# Patient Record
Sex: Female | Born: 2000 | Race: Black or African American | Hispanic: No | Marital: Single | Smoking: Current every day smoker
Health system: Southern US, Community
[De-identification: ages and names within clinical notes are randomized; demographics above are authoritative.]

## PROBLEM LIST (undated history)

## (undated) DIAGNOSIS — Z789 Other specified health status: Secondary | ICD-10-CM

## (undated) DIAGNOSIS — N390 Urinary tract infection, site not specified: Secondary | ICD-10-CM

## (undated) HISTORY — DX: Other specified health status: Z78.9

## (undated) HISTORY — PX: NO PAST SURGERIES: SHX2092

---
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
## 2019-08-11 ENCOUNTER — Encounter (HOSPITAL_BASED_OUTPATIENT_CLINIC_OR_DEPARTMENT_OTHER): Payer: Self-pay

## 2019-08-11 ENCOUNTER — Emergency Department (HOSPITAL_BASED_OUTPATIENT_CLINIC_OR_DEPARTMENT_OTHER): Payer: Managed Care, Other (non HMO)

## 2019-08-11 ENCOUNTER — Other Ambulatory Visit: Payer: Self-pay

## 2019-08-11 ENCOUNTER — Emergency Department (HOSPITAL_BASED_OUTPATIENT_CLINIC_OR_DEPARTMENT_OTHER)
Admission: EM | Admit: 2019-08-11 | Discharge: 2019-08-12 | Disposition: A | Discharge status: Home / Self Care | Payer: Managed Care, Other (non HMO) | Attending: Emergency Medicine | Admitting: Emergency Medicine

## 2019-08-11 DIAGNOSIS — B2790 Infectious mononucleosis, unspecified without complication: Secondary | ICD-10-CM | POA: Diagnosis not present

## 2019-08-11 DIAGNOSIS — R0602 Shortness of breath: Secondary | ICD-10-CM | POA: Diagnosis not present

## 2019-08-11 DIAGNOSIS — J029 Acute pharyngitis, unspecified: Secondary | ICD-10-CM | POA: Diagnosis present

## 2019-08-11 DIAGNOSIS — Z20828 Contact with and (suspected) exposure to other viral communicable diseases: Secondary | ICD-10-CM | POA: Diagnosis not present

## 2019-08-11 MED ORDER — SODIUM CHLORIDE 0.9 % IV BOLUS
1000.0000 mL | Freq: Once | INTRAVENOUS | Status: AC
  Administered 2019-08-11: 23:00:00 1000 mL via INTRAVENOUS

## 2019-08-11 MED ORDER — DEXAMETHASONE SODIUM PHOSPHATE 10 MG/ML IJ SOLN
10.0000 mg | Freq: Once | INTRAMUSCULAR | Status: AC
  Administered 2019-08-11: 10 mg via INTRAVENOUS
  Filled 2019-08-11: qty 1

## 2019-08-11 NOTE — ED Provider Notes (Signed)
[facility] Emergency Department Provider Note MRN:  [id number]  Arrival date & time: 08/11/19     Chief Complaint   Sore Throat   History of Present Illness   Brittney Richardson is a 18 y.o. year-old female with no pertinent past medical history presenting to the ED with chief complaint of sore throat.  1 week of sore throat, which has become severe.  Was tested for strep throat and this was negative, has since tested positive for mono.  She is endorsing generalized discomfort, chest pain, abdominal pain, occasional shortness of breath, malaise.  Denies headache, no vision change, no numbness or weakness to the arms or legs, no leg pain or swelling.  Trouble eating and drinking due to the sore throat.  Review of Systems  A complete 10 system review of systems was obtained and all systems are negative except as noted in the HPI and PMH.   Patient's Health History    Past Medical History:  Diagnosis Date  . UTI (lower urinary tract infection)     History reviewed. No pertinent surgical history.  No family history on file.  Social History   Socioeconomic History  . Marital status: Single    Spouse name: Not on file  . Number of children: Not on file  . Years of education: Not on file  . Highest education level: Not on file  Occupational History  . Not on file  Social Needs  . Financial resource strain: Not on file  . Food insecurity    Worry: Not on file    Inability: Not on file  . Transportation needs    Medical: Not on file    Non-medical: Not on file  Tobacco Use  . Smoking status: Never Smoker  . Smokeless tobacco: Never Used  Substance and Sexual Activity  . Alcohol use: Not Currently  . Drug use: Yes    Types: Marijuana  . Sexual activity: Not on file  Lifestyle  . Physical activity    Days per week: Not on file    Minutes per session: Not on file  . Stress: Not on file  Relationships  . Social Herbalist on  phone: Not on file    Gets together: Not on file    Attends religious service: Not on file    Active member of club or organization: Not on file    Attends meetings of clubs or organizations: Not on file    Relationship status: Not on file  . Intimate partner violence    Fear of current or ex partner: Not on file    Emotionally abused: Not on file    Physically abused: Not on file    Forced sexual activity: Not on file  Other Topics Concern  . Not on file  Social History Narrative  . Not on file     Physical Exam  Vital Signs and Nursing Notes reviewed Vitals:   08/11/19 2144  BP: (!) 114/92  Pulse: (!) 119  Resp: 20  Temp: 100.1 F (37.8 C)  SpO2: 98%    CONSTITUTIONAL: Well-appearing, NAD NEURO:  Alert and oriented x 3, no focal deficits EYES:  eyes equal and reactive ENT/NECK:  no LAD, no JVD; significantly swollen and erythematous tonsils with bilateral exudate no asymmetry CARDIO: Regular rate, well-perfused, normal S1 and S2 PULM:  CTAB no wheezing or rhonchi GI/GU:  normal bowel sounds, non-distended, non-tender MSK/SPINE:  No gross deformities, no edema SKIN:  no  rash, atraumatic PSYCH:  Appropriate speech and behavior  Diagnostic and Interventional Summary    EKG Interpretation  Date/Time:  Thursday August 11 2019 22:45:02 EST Ventricular Rate:  101 PR Interval:    QRS Duration: 61 QT Interval:  309 QTC Calculation: 401 R Axis:   85 Text Interpretation: Sinus tachycardia Borderline T abnormalities, anterior leads Confirmed by Kennis Carina [phone]) on 08/11/2019 10:53:01 PM      Labs Reviewed  SARS CORONAVIRUS 2 (TAT 6-24 HRS)    XR Chest Single View  Final Result      Medications  dexamethasone (DECADRON) injection 10 mg (10 mg Intravenous Given 08/11/19 2238)  sodium chloride 0.9 % bolus 1,000 mL (1,000 mLs Intravenous New Bag/Given 08/11/19 2238)     Procedures  /  Critical Care Procedures  ED Course and Medical Decision Making  I have  reviewed the triage vital signs and the nursing notes.  Pertinent labs & imaging results that were available during my care of the patient were reviewed by me and considered in my medical decision making (see below for details).     Favoring continued symptoms related to mono, also considering COVID-19.  Nothing on exam to suggest peritonsillar or retropharyngeal abscess.  Chest discomfort and shortness of breath do not seem concerning, appear to be more of a general discomfort in the setting of viral illness.  Screening with EKG and chest x-ray.  Patient is tachycardic on arrival but this is thought to be related to dehydration given her trouble with p.o. intake.  Providing IV fluids, steroids, will reassess.  Tachycardia is resolved with only half liter fluids completed.  Patient is continuing to look well.  EKG and chest x-ray reassuring.  She is appropriate for discharge with continued symptomatic management.  Elmer Sow. Pilar Plate, MD [facility] Emergency Medicine [facility] [email or web address]  Final Clinical Impressions(s) / ED Diagnoses     ICD-10-CM   1. Infectious mononucleosis without complication, infectious mononucleosis due to unspecified organism  B27.90   2. SOB (shortness of breath)  R06.02 XR Chest Single View    XR Chest Single View    ED Discharge Orders    None       Discharge Instructions Discussed with and Provided to Patient:     Discharge Instructions     You were evaluated in the Emergency Department and after careful evaluation, we did not find any emergent condition requiring admission or further testing in the hospital.  Your exam/testing today is overall reassuring.  We gave you a steroid medicine here in the emergency department that should help with the swelling and symptoms.  Please drink plenty of fluids at home to stay hydrated.  As discussed, please avoid any trauma or contact sports for the next month.  Please return to the  Emergency Department if you experience any worsening of your condition.  We encourage you to follow up with a primary care provider.  Thank you for allowing [place] to be a part of your care.       Sabas Sous, MD 08/11/19 [phone]

## 2019-08-11 NOTE — ED Triage Notes (Addendum)
Pt c/o sore throat x 7 days -was seen by PCP x 2 this week-neg strep but was stared on amxoil then advised to stop when pos mono test-pt c/o cont'd sore throat, cough, runny nose-NAD-steady gait-pt with muffled voice and difficulty swallowing saliva-tonsils touching and white spots noted

## 2019-08-11 NOTE — Discharge Instructions (Addendum)
You were evaluated in the Emergency Department and after careful evaluation, we did not find any emergent condition requiring admission or further testing in the hospital.  Your exam/testing today is overall reassuring.  We gave you a steroid medicine here in the emergency department that should help with the swelling and symptoms.  Please drink plenty of fluids at home to stay hydrated.  As discussed, please avoid any trauma or contact sports for the next month.  Please return to the Emergency Department if you experience any worsening of your condition.  We encourage you to follow up with a primary care provider.  Thank you for allowing [place] to be a part of your care.

## 2019-08-11 NOTE — ED Notes (Signed)
ED Provider at bedside. 

## 2019-08-12 LAB — SARS CORONAVIRUS 2 (TAT 6-24 HRS): SARS Coronavirus 2: NEGATIVE

## 2019-12-31 ENCOUNTER — Emergency Department (HOSPITAL_COMMUNITY): Payer: Managed Care, Other (non HMO)

## 2019-12-31 ENCOUNTER — Other Ambulatory Visit: Payer: Self-pay

## 2019-12-31 ENCOUNTER — Emergency Department (HOSPITAL_COMMUNITY)
Admission: EM | Admit: 2019-12-31 | Discharge: 2019-12-31 | Disposition: A | Discharge status: Home / Self Care | Payer: Managed Care, Other (non HMO) | Attending: Emergency Medicine | Admitting: Emergency Medicine

## 2019-12-31 ENCOUNTER — Encounter (HOSPITAL_COMMUNITY): Payer: Self-pay | Admitting: Emergency Medicine

## 2019-12-31 DIAGNOSIS — Y9389 Activity, other specified: Secondary | ICD-10-CM | POA: Insufficient documentation

## 2019-12-31 DIAGNOSIS — R109 Unspecified abdominal pain: Secondary | ICD-10-CM | POA: Insufficient documentation

## 2019-12-31 DIAGNOSIS — M542 Cervicalgia: Secondary | ICD-10-CM | POA: Insufficient documentation

## 2019-12-31 DIAGNOSIS — M25512 Pain in left shoulder: Secondary | ICD-10-CM | POA: Diagnosis present

## 2019-12-31 DIAGNOSIS — Y92410 Unspecified street and highway as the place of occurrence of the external cause: Secondary | ICD-10-CM | POA: Diagnosis not present

## 2019-12-31 DIAGNOSIS — M25532 Pain in left wrist: Secondary | ICD-10-CM | POA: Diagnosis not present

## 2019-12-31 DIAGNOSIS — Y999 Unspecified external cause status: Secondary | ICD-10-CM | POA: Diagnosis not present

## 2019-12-31 DIAGNOSIS — M25561 Pain in right knee: Secondary | ICD-10-CM | POA: Insufficient documentation

## 2019-12-31 MED ORDER — IBUPROFEN 400 MG PO TABS
600.0000 mg | ORAL_TABLET | Freq: Once | ORAL | Status: AC
  Administered 2019-12-31: 600 mg via ORAL
  Filled 2019-12-31: qty 1

## 2019-12-31 NOTE — Discharge Instructions (Addendum)
All Xrays are normal, there is no fractures.   Tylenol and ibuprofen for pain. Please return for any new or worsening symptoms.
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
## 2019-12-31 NOTE — ED Notes (Signed)
Pt. Transported to xray 

## 2020-01-26 ENCOUNTER — Ambulatory Visit: Payer: No Typology Code available for payment source | Attending: Family

## 2020-01-26 DIAGNOSIS — Z23 Encounter for immunization: Secondary | ICD-10-CM

## 2020-01-26 NOTE — Progress Notes (Signed)
   Covid-19 Vaccination Clinic  Name:  Brittney Richardson    MRN: [id number] DOB: [birth year]  01/26/2020  Brittney Richardson was observed post Covid-19 immunization for 15 minutes without incident. She was provided with Vaccine Information Sheet and instruction to access the V-Safe system.   Brittney Richardson was instructed to call 911 with any severe reactions post vaccine: Marland Kitchen Difficulty breathing  . Swelling of face and throat  . A fast heartbeat  . A bad rash all over body  . Dizziness and weakness   Immunizations Administered    Name Date Dose VIS Date Route   Moderna COVID-19 Vaccine 01/26/2020  1:39 PM 0.5 mL 09/2019 Intramuscular   Manufacturer: Moderna   Lot: [id number]   NDC: 10071-219-75

## 2020-02-21 ENCOUNTER — Ambulatory Visit: Payer: No Typology Code available for payment source | Attending: Family

## 2020-02-21 ENCOUNTER — Ambulatory Visit: Payer: No Typology Code available for payment source

## 2020-02-21 DIAGNOSIS — Z23 Encounter for immunization: Secondary | ICD-10-CM

## 2020-02-21 NOTE — Progress Notes (Signed)
   Covid-19 Vaccination Clinic  Name:  Brittney Richardson    MRN: [id number] DOB: October 29, 2000  02/21/2020  [name] was observed post Covid-19 immunization for 15 minutes without incident. She was provided with Vaccine Information Sheet and instruction to access the V-Safe system.   [name] was instructed to call 911 with any severe reactions post vaccine: Marland Kitchen Difficulty breathing  . Swelling of face and throat  . A fast heartbeat  . A bad rash all over body  . Dizziness and weakness   Immunizations Administered    Name Date Dose VIS Date Route   Moderna COVID-19 Vaccine 02/21/2020  2:29 PM 0.5 mL 09/2019 Intramuscular   Manufacturer: Moderna   Lot: [id number]   NDC: 66664-861-61

## 2020-10-05 IMAGING — DX DG CHEST 1V PORT
1 series · 1 of 1 positions shown · non-contrast
Comparison: None.

CLINICAL DATA: Sore throat

EXAM:
PORTABLE CHEST 1 VIEW

[chest ap]
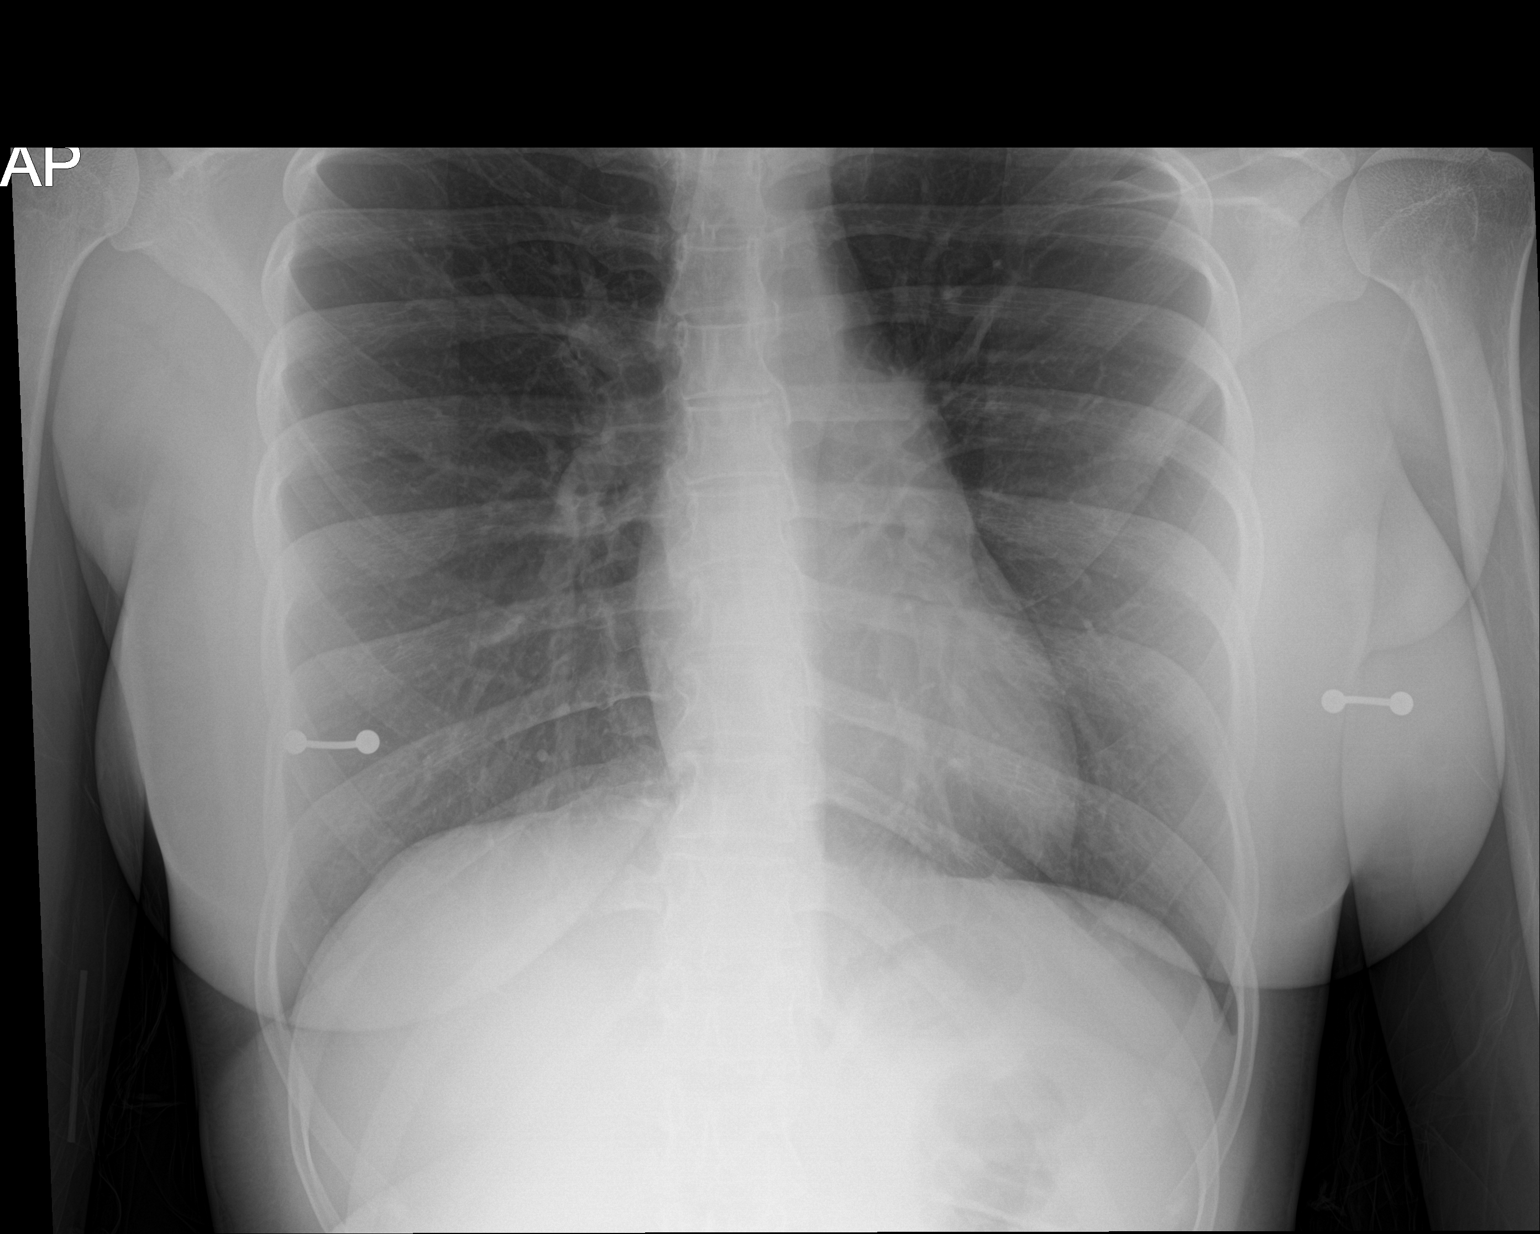

[1 of 1 positions shown; findings below may reference images not displayed]

FINDINGS: The heart size and mediastinal contours are within normal limits.
Both lungs are clear. The visualized skeletal structures are
unremarkable. Bilateral nipple rings.
IMPRESSION: No active disease.

## 2020-11-06 ENCOUNTER — Ambulatory Visit: Payer: Self-pay | Attending: Internal Medicine

## 2020-11-06 DIAGNOSIS — Z23 Encounter for immunization: Secondary | ICD-10-CM

## 2020-11-06 NOTE — Progress Notes (Signed)
   Covid-19 Vaccination Clinic  Name:  Brittney Richardson    MRN: [id number] DOB: Mar 24, 2001  11/06/2020  [name] was observed post Covid-19 immunization for 15 minutes without incident. She was provided with Vaccine Information Sheet and instruction to access the V-Safe system.   [name] was instructed to call 911 with any severe reactions post vaccine: Marland Kitchen Difficulty breathing  . Swelling of face and throat  . A fast heartbeat  . A bad rash all over body  . Dizziness and weakness   Immunizations Administered    Name Date Dose VIS Date Route   Moderna Covid-19 Booster Vaccine 11/06/2020  1:38 PM 0.25 mL 07/25/2020 Intramuscular   Manufacturer: Gala Murdoch   Lot: [id number]   NDC: 22575-051-83

## 2021-12-22 ENCOUNTER — Other Ambulatory Visit: Payer: Self-pay

## 2021-12-22 ENCOUNTER — Emergency Department (HOSPITAL_COMMUNITY)
Admission: EM | Admit: 2021-12-22 | Discharge: 2021-12-23 | Disposition: A | Discharge status: Home / Self Care | Payer: 59 | Attending: Emergency Medicine | Admitting: Emergency Medicine

## 2021-12-22 ENCOUNTER — Encounter (HOSPITAL_COMMUNITY): Payer: Self-pay

## 2021-12-22 DIAGNOSIS — J358 Other chronic diseases of tonsils and adenoids: Secondary | ICD-10-CM | POA: Insufficient documentation

## 2021-12-22 DIAGNOSIS — B9789 Other viral agents as the cause of diseases classified elsewhere: Secondary | ICD-10-CM | POA: Diagnosis not present

## 2021-12-22 DIAGNOSIS — J028 Acute pharyngitis due to other specified organisms: Secondary | ICD-10-CM | POA: Insufficient documentation

## 2021-12-22 DIAGNOSIS — J029 Acute pharyngitis, unspecified: Secondary | ICD-10-CM | POA: Diagnosis present

## 2021-12-22 DIAGNOSIS — Z20822 Contact with and (suspected) exposure to covid-19: Secondary | ICD-10-CM | POA: Insufficient documentation

## 2021-12-22 DIAGNOSIS — H9203 Otalgia, bilateral: Secondary | ICD-10-CM | POA: Insufficient documentation

## 2021-12-22 NOTE — ED Triage Notes (Addendum)
Pt reports with sore throat and redness, congestion, headache, bilateral ear pain, and cough since Thursday. Pt states that she has tonsil stones.  ?

## 2021-12-23 LAB — RESP PANEL BY RT-PCR (FLU A&B, COVID) ARPGX2
Influenza A by PCR: NEGATIVE
Influenza B by PCR: NEGATIVE
SARS Coronavirus 2 by RT PCR: NEGATIVE

## 2021-12-23 LAB — GROUP A STREP BY PCR: Group A Strep by PCR: NOT DETECTED

## 2021-12-23 MED ORDER — DEXAMETHASONE 4 MG PO TABS
10.0000 mg | ORAL_TABLET | Freq: Once | ORAL | Status: AC
  Administered 2021-12-23: 10 mg via ORAL
  Filled 2021-12-23: qty 1

## 2021-12-23 NOTE — ED Provider Notes (Signed)
?[place] DEPT ?[facility] Emergency Department ?Provider Note ?MRN:  [id number]  ?Arrival date & time: 12/23/21    ? ?Chief Complaint   ?Sore Throat, Nasal Congestion, Headache, Cough, and Otalgia ?  ?History of Present Illness   ?Brittney Richardson is a 21 y.o. year-old female with no pertinent past medical history presenting to the ED with chief complaint of sore throat. ? ?Headache, nasal congestion, cough, bilateral otalgia, sore throat for the past few days.  Also feels like she has tonsil stones. ? ?Review of Systems  ?A thorough review of systems was obtained and all systems are negative except as noted in the HPI and PMH.  ? ?Patient's Health History   ? ?Past Medical History:  ?Diagnosis Date  ? UTI (lower urinary tract infection)   ?  ?History reviewed. No pertinent surgical history.  ?History reviewed. No pertinent family history.  ?Social History  ? ?Socioeconomic History  ? Marital status: Single  ?  Spouse name: Not on file  ? Number of children: Not on file  ? Years of education: Not on file  ? Highest education level: Not on file  ?Occupational History  ? Not on file  ?Tobacco Use  ? Smoking status: Never  ? Smokeless tobacco: Never  ?Vaping Use  ? Vaping Use: Never used  ?Substance and Sexual Activity  ? Alcohol use: Not Currently  ? Drug use: Yes  ?  Types: Marijuana  ? Sexual activity: Not on file  ?Other Topics Concern  ? Not on file  ?Social History Narrative  ? Not on file  ? ?Social Determinants of Health  ? ?Financial Resource Strain: Not on file  ?Food Insecurity: Not on file  ?Transportation Needs: Not on file  ?Physical Activity: Not on file  ?Stress: Not on file  ?Social Connections: Not on file  ?Intimate Partner Violence: Not on file  ?  ? ?Physical Exam  ? ?Vitals:  ? 12/22/21 2314  ?BP: 130/84  ?Pulse: 90  ?Resp: 16  ?Temp: 98.5 ?F (36.9 ?C)  ?SpO2: 95%  ?  ?CONSTITUTIONAL: Well-appearing, NAD ?NEURO/PSYCH:  Alert and oriented x 3, no focal deficits, no meningismus ?EYES:   eyes equal and reactive ?ENT/NECK:  no LAD, no JVD; erythema to the posterior oropharynx, tonsils are mildly erythematous and enlarged, white tonsil stones noted bilaterally ?CARDIO: Regular rate, well-perfused, normal S1 and S2 ?PULM:  CTAB no wheezing or rhonchi ?GI/GU:  non-distended, non-tender ?MSK/SPINE:  No gross deformities, no edema ?SKIN:  no rash, atraumatic ? ? ?*Additional and/or pertinent findings included in MDM below ? ?Diagnostic and Interventional Summary  ? ? EKG Interpretation ? ?Date/Time:    ?Ventricular Rate:    ?PR Interval:    ?QRS Duration:   ?QT Interval:    ?QTC Calculation:   ?R Axis:     ?Text Interpretation:   ?  ? ?  ? ?Labs Reviewed  ?GROUP A STREP BY PCR  ?RESP PANEL BY RT-PCR (FLU A&B, COVID) [id number]  ?  ?No orders to display  ?  ?Medications  ?dexamethasone (DECADRON) tablet 10 mg (10 mg Oral Given 12/23/21 0025)  ?  ? ?Procedures  /  Critical Care ?Procedures ? ?ED Course and Medical Decision Making  ?Initial Impression and Ddx ?Favoring viral illness, will swab for strep, COVID, RSV.  Vital signs normal, well-appearing, lungs clear. ? ?Past medical/surgical history that increases complexity of ED encounter: None ? ?Interpretation of Diagnostics ?COVID, flu, strep negative ? ?Patient Reassessment and Ultimate Disposition/Management ?Discharge home.  Providing Decadron for symptomatic relief given the severity of her sore throat.  Nothing on exam to suggest PTA or RPA. ? ?Patient management required discussion with the following services or consulting groups:  None ? ?Complexity of Problems Addressed ?Acute complicated illness or Injury ? ?Additional Data Reviewed and Analyzed ?Further history obtained from: ?None ? ?Additional Factors Impacting ED Encounter Risk ?None ? ?Barth Kirks. Sedonia Small, MD ?[facility] Emergency Medicine ?[email or web address] ? ?Final Clinical Impressions(s) / ED Diagnoses  ? ?  ICD-10-CM   ?1. Viral pharyngitis  J02.9   ?  ?2. Tonsil  stone  J35.8   ?  ?  ?ED Discharge Orders   ? ? None  ? ?  ?  ? ?Discharge Instructions Discussed with and Provided to Patient:  ? ? ?Discharge Instructions   ? ?  ?You were evaluated in the Emergency Department and after careful evaluation, we did not find any emergent condition requiring admission or further testing in the hospital. ? ?Your exam/testing today was overall reassuring.  Your COVID, flu, strep testing was negative.  Recommend continued use of Tylenol and Motrin at home for discomfort, suspect her symptoms are due to some other virus.  If your tonsil stones are still there after 1 week, you can follow-up with the ENT specialists. ? ?Please return to the Emergency Department if you experience any worsening of your condition.  Thank you for allowing [place] to be a part of your care. ? ? ? ? ?  ?Maudie Flakes, MD ?12/23/21 0034 ? ?

## 2021-12-23 NOTE — Discharge Instructions (Signed)
You were evaluated in the Emergency Department and after careful evaluation, we did not find any emergent condition requiring admission or further testing in the hospital. ? ?Your exam/testing today was overall reassuring.  Your COVID, flu, strep testing was negative.  Recommend continued use of Tylenol and Motrin at home for discomfort, suspect her symptoms are due to some other virus.  If your tonsil stones are still there after 1 week, you can follow-up with the ENT specialists. ? ?Please return to the Emergency Department if you experience any worsening of your condition.  Thank you for allowing [place] to be a part of your care. ? ?

## 2023-05-07 ENCOUNTER — Emergency Department (HOSPITAL_COMMUNITY)
Admission: EM | Admit: 2023-05-07 | Discharge: 2023-05-07 | Discharge status: Against Medical Advice | Payer: 59 | Source: Home / Self Care

## 2023-06-16 LAB — HM PAP SMEAR

## 2023-09-10 ENCOUNTER — Encounter (HOSPITAL_BASED_OUTPATIENT_CLINIC_OR_DEPARTMENT_OTHER): Payer: Self-pay | Admitting: Emergency Medicine

## 2023-09-10 ENCOUNTER — Other Ambulatory Visit: Payer: Self-pay

## 2023-09-10 ENCOUNTER — Emergency Department (HOSPITAL_BASED_OUTPATIENT_CLINIC_OR_DEPARTMENT_OTHER)
Admission: EM | Admit: 2023-09-10 | Discharge: 2023-09-10 | Disposition: A | Discharge status: Home / Self Care | Payer: 59 | Attending: Emergency Medicine | Admitting: Emergency Medicine

## 2023-09-10 DIAGNOSIS — U071 COVID-19: Secondary | ICD-10-CM | POA: Insufficient documentation

## 2023-09-10 DIAGNOSIS — R059 Cough, unspecified: Secondary | ICD-10-CM | POA: Diagnosis present

## 2023-09-10 LAB — RESP PANEL BY RT-PCR (RSV, FLU A&B, COVID)  RVPGX2
Influenza A by PCR: NEGATIVE
Influenza B by PCR: NEGATIVE
Resp Syncytial Virus by PCR: NEGATIVE
SARS Coronavirus 2 by RT PCR: POSITIVE — AB

## 2023-09-10 NOTE — Discharge Instructions (Signed)
Please use Tylenol or ibuprofen for pain.  You may use 600 mg ibuprofen every 6 hours or 1000 mg of Tylenol every 6 hours.  You may choose to alternate between the 2.  This would be most effective.  Not to exceed 4 g of Tylenol within 24 hours.  Not to exceed 3200 mg ibuprofen 24 hours.  Please drink plenty of fluids, and use over-the-counter cough and cold medication such as Robitussin, NyQuil, DayQuil, Mucinex for symptomatic relief, make sure that you do not take any medication that contains Tylenol along with an oral dose of Tylenol, the maximum dose that you are trying to avoid exceeding is 1000 mg.  You are okay to return to work on Saturday as long as you are fever free.  If you continue to have worsening symptoms, shortness of breath recommend that you return to the emergency department for further evaluation.

## 2023-09-10 NOTE — ED Triage Notes (Signed)
Pt c/o cough, sore throat, chills, SOB, congestion. Positive COVID test at home, requesting to be tested here and doctor's note for work.

## 2023-09-10 NOTE — ED Provider Notes (Signed)
[place] EMERGENCY DEPARTMENT AT [facility] Provider Note   CSN: [id number] Arrival date & time: 09/10/23  1022     History  Chief Complaint  Patient presents with   Cough    Brittney Richardson is a 22 y.o. female with overall noncontributory past medical history presents concern for 3 days of cough, sore throat, runny nose, congestion.  Patient reports that she tested positive for COVID at home today, began having symptoms on Monday.  She has been taking Robitussin, NyQuil and does report that her symptoms are improving.  She has had to miss work and was requesting a work note.   Cough      Home Medications Prior to Admission medications   Medication Sig Start Date End Date Taking? Authorizing Provider  ondansetron (ZOFRAN ODT) 4 MG disintegrating tablet Take 1 tablet (4 mg total) by mouth every 6 (six) hours as needed for nausea or vomiting. 11/10/18   Ward, Layla Maw, DO  sulfamethoxazole-trimethoprim (BACTRIM,SEPTRA) 200-40 MG/5ML suspension 20 mls po bid x 10 days 10/06/14   Viviano Simas, NP      Allergies    Patient has no known allergies.    Review of Systems   Review of Systems  Respiratory:  Positive for cough.   All other systems reviewed and are negative.   Physical Exam Updated Vital Signs BP (!) 103/92 (BP Location: Right Arm)   Pulse (!) 102   Temp 98.5 F (36.9 C) (Oral)   Resp 20   Ht 5\' 4"  (1.626 m)   Wt 81.6 kg   LMP 07/25/2023 (Exact Date)   SpO2 98%   BMI 30.90 kg/m  Physical Exam Vitals and nursing note reviewed.  Constitutional:      General: She is not in acute distress.    Appearance: Normal appearance.  HENT:     Head: Normocephalic and atraumatic.     Mouth/Throat:     Comments: Mild posterior oropharynx erythema, no swelling, exudate. Uvula midline, tonsils 1+ bilaterally.  No trismus, stridor, evidence of PTA, floor of mouth swelling or redness.   Eyes:     General:        Right eye: No discharge.        Left  eye: No discharge.  Cardiovascular:     Rate and Rhythm: Normal rate and regular rhythm.     Heart sounds: No murmur heard.    No friction rub. No gallop.  Pulmonary:     Effort: Pulmonary effort is normal.     Breath sounds: Normal breath sounds.     Comments: No wheezing, rhonchi, stridor, rales Abdominal:     General: Bowel sounds are normal.     Palpations: Abdomen is soft.  Skin:    General: Skin is warm and dry.     Capillary Refill: Capillary refill takes less than 2 seconds.  Neurological:     Mental Status: She is alert and oriented to person, place, and time.  Psychiatric:        Mood and Affect: Mood normal.        Behavior: Behavior normal.     ED Results / Procedures / Treatments   Labs (all labs ordered are listed, but only abnormal results are displayed) Labs Reviewed  RESP PANEL BY RT-PCR (RSV, FLU A&B, COVID)  RVPGX2 - Abnormal; Notable for the following components:      Result Value   SARS Coronavirus 2 by RT PCR POSITIVE (*)    All other components within  normal limits    EKG None  Radiology No results found.  Procedures Procedures    Medications Ordered in ED Medications - No data to display  ED Course/ Medical Decision Making/ A&P                                 Medical Decision Making  This is a well-appearing 22yo female who presents with concern for 3 days of cough, fever, sore throat, headache.  My emergent differential diagnosis includes acute upper respiratory infection with COVID, flu, RSV versus new asthma presentation, acute bronchitis, less clinical concern for pneumonia.  Also considered other ENT emergencies, Ludwig angina, strep pharyngitis, mono, versus epiglottis, tonsillitis versus other.  This is not an exhaustive differential.  On my exam patient is overall well-appearing, they have temperature of 98.5, breathing unlabored, no tachypnea, no respiratory distress, stable oxygen saturation.  Patient with mild tachycardia.   RVP  independently reviewed by myself shows positive for COVID 19.  Patient symptoms are consistent with covid.  Encouraged ibuprofen, Tylenol, rest, plenty of fluids.  Discussed extensive return precautions.  Patient discharged in stable condition at this time.  Final Clinical Impression(s) / ED Diagnoses Final diagnoses:  COVID-19    Rx / DC Orders ED Discharge Orders     None         [place] 09/10/23 1205    Derwood Kaplan, MD 09/10/23 1214

## 2024-05-25 ENCOUNTER — Encounter: Payer: Self-pay | Admitting: Family Medicine

## 2024-05-25 ENCOUNTER — Ambulatory Visit: Payer: Self-pay | Admitting: Family Medicine

## 2024-05-25 ENCOUNTER — Ambulatory Visit (INDEPENDENT_AMBULATORY_CARE_PROVIDER_SITE_OTHER): Admitting: Family Medicine

## 2024-05-25 VITALS — BP 126/80 | HR 93 | Temp 98.0°F | Resp 16 | Ht 64.0 in | Wt 190.4 lb

## 2024-05-25 DIAGNOSIS — Z0000 Encounter for general adult medical examination without abnormal findings: Secondary | ICD-10-CM | POA: Diagnosis not present

## 2024-05-25 DIAGNOSIS — Z23 Encounter for immunization: Secondary | ICD-10-CM

## 2024-05-25 DIAGNOSIS — Z1159 Encounter for screening for other viral diseases: Secondary | ICD-10-CM

## 2024-05-25 LAB — COMPREHENSIVE METABOLIC PANEL WITH GFR
ALT: 9 U/L (ref 0–35)
AST: 14 U/L (ref 0–37)
Albumin: 4.4 g/dL (ref 3.5–5.2)
Alkaline Phosphatase: 48 U/L (ref 39–117)
BUN: 12 mg/dL (ref 6–23)
CO2: 27 meq/L (ref 19–32)
Calcium: 9 mg/dL (ref 8.4–10.5)
Chloride: 105 meq/L (ref 96–112)
Creatinine, Ser: 0.84 mg/dL (ref 0.40–1.20)
GFR: 97.75 mL/min (ref >60.00)
Glucose, Bld: 79 mg/dL (ref 70–99)
Potassium: 3.8 meq/L (ref 3.5–5.1)
Sodium: 139 meq/L (ref 135–145)
Total Bilirubin: 0.7 mg/dL (ref 0.2–1.2)
Total Protein: 7.5 g/dL (ref 6.0–8.3)

## 2024-05-25 LAB — CBC
HCT: 39.4 % (ref 36.0–46.0)
Hemoglobin: 13.2 g/dL (ref 12.0–15.0)
MCHC: 33.5 g/dL (ref 30.0–36.0)
MCV: 94.9 fl (ref 78.0–100.0)
Platelets: 248 K/uL (ref 150.0–400.0)
RBC: 4.15 Mil/uL (ref 3.87–5.11)
RDW: 12.3 % (ref 11.5–15.5)
WBC: 5.8 K/uL (ref 4.0–10.5)

## 2024-05-25 LAB — LIPID PANEL
Cholesterol: 179 mg/dL (ref 0–200)
HDL: 59.1 mg/dL (ref >39.00)
LDL Cholesterol: 110 mg/dL — ABNORMAL HIGH (ref 0–99)
NonHDL: 119.86
Total CHOL/HDL Ratio: 3
Triglycerides: 51 mg/dL (ref 0.0–149.0)
VLDL: 10.2 mg/dL (ref 0.0–40.0)

## 2024-05-25 NOTE — Progress Notes (Signed)
 Chief Complaint  Patient presents with   Establish Care    Establishing Care     Well Woman Brittney Richardson is here for a complete physical.   Her last physical was >1 year ago.  Current diet: in general, diet could be better Current exercise: none. Contraception? Yes- Nexplanon Fatigue out of ordinary? No Seatbelt? Yes Advanced directive? No  Health Maintenance Pap/HPV- Yes Tetanus- Unsure HIV screening- Yes Hep C screening- No  Past Medical History:  Diagnosis Date   No known health problems      Past Surgical History:  Procedure Laterality Date   NO PAST SURGERIES      Medications  Takes no meds routinely.     Allergies No Known Allergies  Review of Systems: Constitutional:  no unexpected weight changes Eye:  no recent significant change in vision Ear/Nose/Mouth/Throat:  Ears:  no tinnitus or vertigo and no recent change in hearing Nose/Mouth/Throat:  no complaints of nasal congestion, no sore throat Cardiovascular: no chest pain Respiratory:  no cough and no shortness of breath Gastrointestinal:  no abdominal pain, no change in bowel habits GU:  Female: negative for dysuria or pelvic pain Musculoskeletal/Extremities:  no pain of the joints Integumentary (Skin/Breast):  no abnormal skin lesions reported Neurologic:  no headaches Endocrine:  denies fatigue Hematologic/Lymphatic:  No areas of easy bleeding  Exam BP 126/80 (BP Location: Left Arm, Patient Position: Sitting)   Pulse 93   Temp 98 F (36.7 C) (Oral)   Resp 16   Ht 5' 4 (1.626 m)   Wt 190 lb 6.4 oz (86.4 kg)   SpO2 100%   BMI 32.68 kg/m  General:  well developed, well nourished, in no apparent distress Skin:  no significant moles, warts, or growths Head:  no masses, lesions, or tenderness Eyes:  pupils equal and round, sclera anicteric without injection Ears:  canals without lesions, TMs shiny without retraction, no obvious effusion, no erythema Nose:  nares patent, mucosa normal,  and no drainage  Throat/Pharynx:  lips and gingiva without lesion; tongue and uvula midline; non-inflamed pharynx; no exudates or postnasal drainage Neck: neck supple without adenopathy, thyromegaly, or masses Lungs:  clear to auscultation, breath sounds equal bilaterally, no respiratory distress Cardio:  regular rate and rhythm, no bruits, no LE edema Abdomen:  abdomen soft, nontender; bowel sounds normal; no masses or organomegaly Genital: Defer to GYN Musculoskeletal:  symmetrical muscle groups noted without atrophy or deformity Extremities:  no clubbing, cyanosis, or edema, no deformities, no skin discoloration Neuro:  gait normal; deep tendon reflexes normal and symmetric Psych: well oriented with normal range of affect and appropriate judgment/insight  Assessment and Plan  Well adult exam - Plan: CBC, Comprehensive metabolic panel with GFR, Lipid panel  Encounter for hepatitis C screening test for low risk patient - Plan: Hepatitis C antibody   Well 23 y.o. female. Counseled on diet and exercise. Advanced directive form provided today.  Tdap today.  Offered PCV20, declined. Will focus on stopping vaping.  Hep C screening as above.  Requesting GYN cerv CA screening. Other orders as above. Follow up in 1 yr. The patient voiced understanding and agreement to the plan.  [name], DO 05/25/24 10:55 AM

## 2024-05-25 NOTE — Patient Instructions (Signed)
 Give us  2-3 business days to get the results of your labs back.   Keep the diet clean and stay active.  Aim to do some physical exertion for 150 minutes per week. This is typically divided into 5 days per week, 30 minutes per day. The activity should be enough to get your heart rate up. Anything is better than nothing if you have time constraints.  I recommend getting the flu shot in mid October. This suggestion would change if the CDC comes out with a different recommendation.   Please get me a copy of your advanced directive form at your convenience.   Please stop vaping.   Let us  know if you need anything.

## 2024-05-26 LAB — HEPATITIS C ANTIBODY: Hepatitis C Ab: NONREACTIVE

## 2024-08-26 ENCOUNTER — Ambulatory Visit: Payer: Self-pay | Admitting: Family Medicine

## 2024-08-26 ENCOUNTER — Ambulatory Visit: Admitting: Family Medicine

## 2024-08-26 ENCOUNTER — Encounter: Payer: Self-pay | Admitting: Family Medicine

## 2024-08-26 VITALS — BP 110/74 | HR 98 | Temp 98.0°F | Resp 16 | Ht 64.0 in | Wt 194.2 lb

## 2024-08-26 DIAGNOSIS — Z01810 Encounter for preprocedural cardiovascular examination: Secondary | ICD-10-CM

## 2024-08-26 LAB — CBC WITH DIFFERENTIAL/PLATELET
Basophils Absolute: 0.4 K/uL — ABNORMAL HIGH (ref 0.0–0.1)
Basophils Relative: 6.6 % — ABNORMAL HIGH (ref 0.0–3.0)
Eosinophils Absolute: 0.1 K/uL (ref 0.0–0.7)
Eosinophils Relative: 1.8 % (ref 0.0–5.0)
HCT: 39.1 % (ref 36.0–46.0)
Hemoglobin: 13.1 g/dL (ref 12.0–15.0)
Lymphocytes Relative: 26 % (ref 12.0–46.0)
Lymphs Abs: 1.7 K/uL (ref 0.7–4.0)
MCHC: 33.4 g/dL (ref 30.0–36.0)
MCV: 94.6 fl (ref 78.0–100.0)
Monocytes Absolute: 0.6 K/uL (ref 0.1–1.0)
Monocytes Relative: 8.6 % (ref 3.0–12.0)
Neutro Abs: 3.8 K/uL (ref 1.4–7.7)
Neutrophils Relative %: 57 % (ref 43.0–77.0)
Platelets: 223 K/uL (ref 150.0–400.0)
RBC: 4.13 Mil/uL (ref 3.87–5.11)
RDW: 12.5 % (ref 11.5–15.5)
WBC: 6.7 K/uL (ref 4.0–10.5)

## 2024-08-26 LAB — COMPREHENSIVE METABOLIC PANEL WITH GFR
ALT: 8 U/L (ref 0–35)
AST: 13 U/L (ref 0–37)
Albumin: 4.4 g/dL (ref 3.5–5.2)
Alkaline Phosphatase: 45 U/L (ref 39–117)
BUN: 12 mg/dL (ref 6–23)
CO2: 23 meq/L (ref 19–32)
Calcium: 9.3 mg/dL (ref 8.4–10.5)
Chloride: 103 meq/L (ref 96–112)
Creatinine, Ser: 0.89 mg/dL (ref 0.40–1.20)
GFR: 91.03 mL/min (ref >60.00)
Glucose, Bld: 79 mg/dL (ref 70–99)
Potassium: 4.2 meq/L (ref 3.5–5.1)
Sodium: 139 meq/L (ref 135–145)
Total Bilirubin: 0.7 mg/dL (ref 0.2–1.2)
Total Protein: 7.4 g/dL (ref 6.0–8.3)

## 2024-08-26 LAB — PROTIME-INR
INR: 1.3 ratio — ABNORMAL HIGH (ref 0.8–1.0)
Prothrombin Time: 13.6 s — ABNORMAL HIGH (ref 9.6–13.1)

## 2024-08-26 LAB — APTT: aPTT: 32.5 s (ref 25.4–36.8)

## 2024-08-26 NOTE — Patient Instructions (Signed)
 Give us  2-3 business days to get the results of your labs back.   Keep the diet clean and stay active.  Let us  know if you need anything.

## 2024-08-26 NOTE — Progress Notes (Signed)
 Subjective:   Chief Complaint  Patient presents with   Pre-op Exam    Pre-Op Exam    Mayrani  is here for a Pre-operative physical at the request of Dr. Shannon.   She is having liposuction surgery pending clearance from us .  Personal or family hx of adverse outcome to anesthesia? No  Chipped, cracked, missing, or loose teeth? No  Decreased ROM of neck? No  Able to walk up 2 flights of stairs without becoming significantly short of breath or having chest pain? Yes   Revised Goldman Criteria: High Risk Surgery (intraperitoneal, intrathoracic, aortic): No  Ischemic heart disease (Prior MI, +excercise stress test, angina, nitrate use, Qwave): No  History of heart failure: No  History of cerebrovascular disease: No  History of diabetes: No  Insulin therapy for DM: No  Preoperative Cr >2.0: No   Past Medical History:  Diagnosis Date   No known health problems     Past Surgical History:  Procedure Laterality Date   NO PAST SURGERIES      She takes no medications routinely.   No Known Allergies  Family History  Problem Relation Age of Onset   Heart disease Neg Hx    Diabetes Neg Hx    Cancer Neg Hx      Review of Systems:  Constitutional:  no fevers Eye:  no recent significant change in vision Ear:  no hearing loss Nose/Mouth/Throat:  No dental complaints Neck/Thyroid:  no lumps or masses Pulmonary:  No shortness of breath Cardiovascular:  no chest pain Gastrointestinal:  no abdominal pain GU:  negative for dysuria Musculoskeletal/Extremities:  no pain Skin/Integumentary ROS:  no abnormal skin lesions reported Neurologic:  no HA   Objective:   Vitals:   08/26/24 0712  BP: 110/74  Pulse: 98  Resp: 16  Temp: 98 F (36.7 C)  TempSrc: Oral  SpO2: 99%  Weight: 194 lb 3.2 oz (88.1 kg)  Height: 5' 4 (1.626 m)   Body mass index is 33.33 kg/m.  General:  well developed, well nourished, in no apparent distress Skin:  warm, no pallor or diaphoresis Head:   normocephalic, atraumatic Eyes:  pupils equal and round, sclera anicteric without injection Ears:  canals without lesions, TMs shiny without retraction, no obvious effusion, no erythema Throat/Pharynx:  lips and gingiva without lesion; tongue and uvula midline; non-inflamed pharynx; no exudates or postnasal drainage Neck: neck supple without adenopathy, thyromegaly, or masses, no bruits, no jugular venous distention Lungs:  clear to auscultation, breath sounds equal bilaterally, no respiratory distress Cardio:  regular rate and rhythm without murmurs Abdomen:  abdomen soft, nontender; bowel sounds normal; no masses, hepatomegaly or splenomegaly Musculoskeletal:  symmetrical muscle groups noted without atrophy or deformity Extremities:  no clubbing, cyanosis, or edema, no deformities, no skin discoloration Neuro:  gait normal; deep tendon reflexes normal and symmetric and alert and oriented to person, place, and time Psych: Age appropriate judgment and insight; normal mood   Assessment:   Preop cardiovascular exam - Plan: PTT, Protime-INR, Comprehensive metabolic panel with GFR, CBC w/Diff   Plan:   Patient is having a liposuction procedure pending clearance from us .  CBC with differential, PTT, PT, and CMP requested. The above laboratory work was ordered and will be sent with this physical. Pending the above workup, the patient is deemed low cardiac risk for the proposed procedure.  The patient voiced understanding and agreement to the plan.  Mabel Mt [place], DO 08/26/24  8:00 AM

## 2024-08-29 ENCOUNTER — Other Ambulatory Visit: Payer: Self-pay

## 2024-08-29 DIAGNOSIS — Z01810 Encounter for preprocedural cardiovascular examination: Secondary | ICD-10-CM

## 2024-09-05 ENCOUNTER — Telehealth: Payer: Self-pay

## 2024-09-05 NOTE — Telephone Encounter (Signed)
 done

## 2024-09-06 ENCOUNTER — Other Ambulatory Visit (INDEPENDENT_AMBULATORY_CARE_PROVIDER_SITE_OTHER)

## 2024-09-06 ENCOUNTER — Ambulatory Visit: Admitting: Family Medicine

## 2024-09-06 ENCOUNTER — Other Ambulatory Visit

## 2024-09-06 ENCOUNTER — Ambulatory Visit: Payer: Self-pay | Admitting: Family Medicine

## 2024-09-06 DIAGNOSIS — R791 Abnormal coagulation profile: Secondary | ICD-10-CM | POA: Diagnosis not present

## 2024-09-06 DIAGNOSIS — Z01810 Encounter for preprocedural cardiovascular examination: Secondary | ICD-10-CM

## 2024-09-06 LAB — APTT: aPTT: 31.6 s (ref 25.4–36.8)

## 2024-09-06 NOTE — Telephone Encounter (Signed)
 Can we add a PT/INR from todays labs?

## 2024-09-06 NOTE — Telephone Encounter (Signed)
 Copied from CRM #[id number]. Topic: Clinical - Lab/Test Results >> Sep 06, 2024  2:33 PM Donna BRAVO wrote: Reason for CRM: patient calling asking what labs were done this morning and if the INR was done and if it can be added.  Read note verbatim:  Mabel Deward Pry, DO 09/06/2024  1:51 PM EST Back to Top  Are we able to add a PT/INR? That's what needed to be retested. Thx.   Would like to speak with a nurse

## 2024-09-07 LAB — PROTIME-INR
INR: 1.2 ratio — ABNORMAL HIGH (ref 0.8–1.0)
Prothrombin Time: 12.6 s (ref 9.6–13.1)

## 2024-09-15 ENCOUNTER — Ambulatory Visit
Admission: EM | Admit: 2024-09-15 | Discharge: 2024-09-15 | Disposition: A | Discharge status: Home / Self Care | Attending: Nurse Practitioner | Admitting: Nurse Practitioner

## 2024-09-15 DIAGNOSIS — Z113 Encounter for screening for infections with a predominantly sexual mode of transmission: Secondary | ICD-10-CM | POA: Insufficient documentation

## 2024-09-15 LAB — POCT URINE DIPSTICK
Bilirubin, UA: NEGATIVE
Blood, UA: NEGATIVE
Glucose, UA: NEGATIVE mg/dL
Leukocytes, UA: NEGATIVE
Nitrite, UA: NEGATIVE
POC PROTEIN,UA: NEGATIVE
Spec Grav, UA: 1.025 (ref 1.010–1.025)
Urobilinogen, UA: 1 U/dL
pH, UA: 6 (ref 5.0–8.0)

## 2024-09-15 LAB — POCT URINE PREGNANCY: Preg Test, Ur: NEGATIVE

## 2024-09-15 NOTE — Discharge Instructions (Signed)
 Testing for gonorrhea, chlamydia, trichomonas, bacterial vaginosis and yeast is pending. You should not have any sexual activity until you receive the results of the tests. You will only be notified for positive results. You may go online to MyChart and review your results. Practice safe sex practices by wearing a condom every time you have sex. Remember that people who have STIs may not experience any symptoms. However, even without symptoms, these infections can be spread from person to person and require treatment. STIs can be treated, and many STIs can be cured. However, some STIs cannot be cured and will affect you for the rest of your life. It's important to be checked regularly for STIs. You should also consider taking pre-exposure prophylaxis (PrEP) to prevent HIV infection.

## 2024-09-15 NOTE — ED Provider Notes (Signed)
 [facility]    CSN: [id number] Arrival date & time: 09/15/24  0805      History   Chief Complaint Chief Complaint  Patient presents with   SEXUALLY TRANSMITTED DISEASE    HPI Brittney Richardson is a 23 y.o. female.   Discussed the use of AI scribe software for clinical note transcription with the patient, who gave verbal consent to proceed.   The patient presents for STD testing due to urinary symptoms. She reports that her urine has a really strong pissy smell despite drinking nothing but water, which she describes as not normal for her. She occasionally notices a fish smell as well. She describes her urine as smelling like garbage rather than normal urine. The patient reports getting bacterial vaginosis chronically, approximately once a year, and notes that her urine stinks during these episodes. She is sexually active with one female partner in the past 3 months and uses condoms sometimes. She is on birth control and her last menstrual period was on the 22nd. She denies itching, irritation, vaginal discharge, dysuria, or sores in the vaginal region or mouth.  The following sections of the patient's history were reviewed and updated as appropriate: allergies, current medications, past family history, past medical history, past social history, past surgical history, and problem list.     Past Medical History:  Diagnosis Date   No known health problems     There are no active problems to display for this patient.   Past Surgical History:  Procedure Laterality Date   NO PAST SURGERIES      OB History   No obstetric history on file.      Home Medications    Prior to Admission medications  Not on File    Family History Family History  Problem Relation Age of Onset   Heart disease Neg Hx    Diabetes Neg Hx    Cancer Neg Hx     Social History Social History[1]   Allergies   Patient has no known allergies.   Review of Systems Review of  Systems  HENT:  Negative for mouth sores.   Genitourinary:  Negative for dysuria, genital sores, menstrual problem (LMP: 08/27/24) and vaginal discharge.       Strong foul smelling urine, occasional fishy smell as well. No irritation or itching.   All other systems reviewed and are negative.    Physical Exam Triage Vital Signs ED Triage Vitals  Encounter Vitals Group     BP 09/15/24 0825 106/64     Girls Systolic BP Percentile --      Girls Diastolic BP Percentile --      Boys Systolic BP Percentile --      Boys Diastolic BP Percentile --      Pulse Rate 09/15/24 0825 78     Resp 09/15/24 0825 16     Temp 09/15/24 0825 98.7 F (37.1 C)     Temp Source 09/15/24 0825 Oral     SpO2 09/15/24 0825 96 %     Weight --      Height --      Head Circumference --      Peak Flow --      Pain Score 09/15/24 0824 0     Pain Loc --      Pain Education --      Exclude from Growth Chart --    No data found.  Updated Vital Signs BP 106/64 (BP Location: Left Arm)   Pulse  78   Temp 98.7 F (37.1 C) (Oral)   Resp 16   LMP 08/27/2024 (Exact Date)   SpO2 96%   Visual Acuity Right Eye Distance:   Left Eye Distance:   Bilateral Distance:    Right Eye Near:   Left Eye Near:    Bilateral Near:     Physical Exam Constitutional:      General: She is not in acute distress.    Appearance: Normal appearance. She is not ill-appearing, toxic-appearing or diaphoretic.  HENT:     Head: Normocephalic.     Nose: Nose normal.     Mouth/Throat:     Mouth: Mucous membranes are moist.  Eyes:     Conjunctiva/sclera: Conjunctivae normal.  Cardiovascular:     Rate and Rhythm: Normal rate.  Pulmonary:     Effort: Pulmonary effort is normal.  Abdominal:     Palpations: Abdomen is soft.  Genitourinary:    Comments: Deferred; patient performed self-swab for Aptima testing  Musculoskeletal:        General: Normal range of motion.     Cervical back: Normal range of motion and neck supple.   Skin:    General: Skin is warm and dry.  Neurological:     General: No focal deficit present.     Mental Status: She is alert and oriented to person, place, and time.  Psychiatric:        Mood and Affect: Mood normal.        Behavior: Behavior normal.      UC Treatments / Results  Labs (all labs ordered are listed, but only abnormal results are displayed) Labs Reviewed  POCT URINE DIPSTICK - Abnormal; Notable for the following components:      Result Value   Ketones, POC UA trace (5) (*)    All other components within normal limits  POCT URINE PREGNANCY  CERVICOVAGINAL ANCILLARY ONLY    EKG   Radiology No results found.  Procedures Procedures (including critical care time)  Medications Ordered in UC Medications - No data to display  Initial Impression / Assessment and Plan / UC Course  I have reviewed the triage vital signs and the nursing notes.  Pertinent labs & imaging results that were available during my care of the patient were reviewed by me and considered in my medical decision making (see chart for details).     Patient presents with foul smelling odor and STD testing. Tests obtained today include gonorrhea, chlamydia, trichomonas, HIV, and syphilis. Results are pending. Patient was counseled to abstain from sexual activity until all results have been received, any necessary treatment has been completed, and any symptoms, if present, have resolved. Safe sex practices were discussed and encouraged, including consistent condom use and regular screening with new partners. Patient advised they will only be contacted if any results are positive or require follow-up; otherwise, they may review their results through MyChart.  Today's evaluation has revealed no signs of a dangerous process. Discussed diagnosis with patient and/or guardian. Patient and/or guardian aware of their diagnosis, possible red flag symptoms to watch out for and need for close follow up. Patient  and/or guardian understands verbal and written discharge instructions. Patient and/or guardian comfortable with plan and disposition.  Patient and/or guardian has a clear mental status at this time, good insight into illness (after discussion and teaching) and has clear judgment to make decisions regarding their care  Documentation was completed with the aid of voice recognition software. Transcription may contain  typographical errors.   Final Clinical Impressions(s) / UC Diagnoses   Final diagnoses:  Screen for STD (sexually transmitted disease)     Discharge Instructions      Testing for gonorrhea, chlamydia, trichomonas, bacterial vaginosis and yeast is pending. You should not have any sexual activity until you receive the results of the tests. You will only be notified for positive results. You may go online to MyChart and review your results. Practice safe sex practices by wearing a condom every time you have sex. Remember that people who have STIs may not experience any symptoms. However, even without symptoms, these infections can be spread from person to person and require treatment. STIs can be treated, and many STIs can be cured. However, some STIs cannot be cured and will affect you for the rest of your life. It's important to be checked regularly for STIs. You should also consider taking pre-exposure prophylaxis (PrEP) to prevent HIV infection.      ED Prescriptions   None    PDMP not reviewed this encounter.      [1]  Social History Tobacco Use   Smoking status: Every Day    Types: E-cigarettes   Smokeless tobacco: Never  Vaping Use   Vaping status: Never Used  Substance Use Topics   Alcohol use: Yes    Comment: Social   Drug use: Not Currently     Iola Lukes, [place] 09/15/24 1442

## 2024-09-15 NOTE — ED Triage Notes (Signed)
 Pt states she wants to be tested for STD's. States her urine and vagina have a foul odor.

## 2024-09-16 LAB — CERVICOVAGINAL ANCILLARY ONLY
Bacterial Vaginitis (gardnerella): NEGATIVE
Candida Glabrata: NEGATIVE
Candida Vaginitis: NEGATIVE
Chlamydia: NEGATIVE
Comment: NEGATIVE
Comment: NEGATIVE
Comment: NEGATIVE
Comment: NEGATIVE
Comment: NEGATIVE
Comment: NORMAL
Neisseria Gonorrhea: NEGATIVE
Trichomonas: NEGATIVE

## 2025-05-26 ENCOUNTER — Encounter: Admitting: Family Medicine
